# Patient Record
Sex: Female | Born: 1975 | Race: White | Hispanic: No | Marital: Single | Smoking: Never smoker
Health system: Southern US, Community
[De-identification: ages and names within clinical notes are randomized; demographics above are authoritative.]

---

## 2008-11-07 ENCOUNTER — Ambulatory Visit: Payer: Self-pay | Admitting: Family Medicine

## 2013-04-11 ENCOUNTER — Emergency Department: Payer: Self-pay | Admitting: Emergency Medicine
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
## 2022-05-14 ENCOUNTER — Emergency Department (HOSPITAL_COMMUNITY)
Admission: EM | Admit: 2022-05-14 | Discharge: 2022-05-15 | Disposition: A | Discharge status: Home / Self Care | Payer: 59 | Attending: Emergency Medicine | Admitting: Emergency Medicine

## 2022-05-14 ENCOUNTER — Other Ambulatory Visit: Payer: Self-pay

## 2022-05-14 ENCOUNTER — Encounter (HOSPITAL_COMMUNITY): Payer: Self-pay

## 2022-05-14 DIAGNOSIS — Z5189 Encounter for other specified aftercare: Secondary | ICD-10-CM

## 2022-05-14 DIAGNOSIS — L7622 Postprocedural hemorrhage and hematoma of skin and subcutaneous tissue following other procedure: Secondary | ICD-10-CM | POA: Insufficient documentation

## 2022-05-14 LAB — CBC WITH DIFFERENTIAL/PLATELET
Abs Immature Granulocytes: 0.02 10*3/uL (ref 0.00–0.07)
Basophils Absolute: 0.1 10*3/uL (ref 0.0–0.1)
Basophils Relative: 1 %
Eosinophils Absolute: 0.2 10*3/uL (ref 0.0–0.5)
Eosinophils Relative: 2 %
HCT: 37.4 % (ref 36.0–46.0)
Hemoglobin: 11.5 g/dL — ABNORMAL LOW (ref 12.0–15.0)
Immature Granulocytes: 0 %
Lymphocytes Relative: 23 %
Lymphs Abs: 2.1 10*3/uL (ref 0.7–4.0)
MCH: 27.9 pg (ref 26.0–34.0)
MCHC: 30.7 g/dL (ref 30.0–36.0)
MCV: 90.8 fL (ref 80.0–100.0)
Monocytes Absolute: 0.6 10*3/uL (ref 0.1–1.0)
Monocytes Relative: 6 %
Neutro Abs: 6.1 10*3/uL (ref 1.7–7.7)
Neutrophils Relative %: 68 %
Platelets: 275 10*3/uL (ref 150–400)
RBC: 4.12 MIL/uL (ref 3.87–5.11)
RDW: 13.7 % (ref 11.5–15.5)
WBC: 9.1 10*3/uL (ref 4.0–10.5)
nRBC: 0 % (ref 0.0–0.2)

## 2022-05-14 NOTE — ED Provider Triage Note (Signed)
Emergency Medicine Provider Triage Evaluation Note  Hannah James , a 46 y.o. female  was evaluated in triage.  Pt complains of continued bleeding from right breast status post biopsy earlier today.  Review of Systems  Positive: wound Negative: fever  Physical Exam  BP 124/84   Pulse 94   Temp (!) 97.4 F (36.3 C)   Resp 18   SpO2 100%  Gen:   Awake, no distress   Resp:  Normal effort  MSK:   Moves extremities without difficulty  Other:  Bleeding from right breast  Medical Decision Making  Medically screening exam initiated at 11:32 PM.  Appropriate orders placed.  Hannah James was informed that the remainder of the evaluation will be completed by another provider, this initial triage assessment does not replace that evaluation, and the importance of remaining in the ED until their evaluation is complete.  Labs to check hemoglobin   Hannah Stabile, PA-C 05/14/22 2332

## 2022-05-14 NOTE — ED Triage Notes (Signed)
Right breast biopsy done today at [facility].   Has had excessive bleeding since procedure.

## 2022-05-15 LAB — BASIC METABOLIC PANEL
Anion gap: 10 (ref 5–15)
BUN: 9 mg/dL (ref 6–20)
CO2: 21 mmol/L — ABNORMAL LOW (ref 22–32)
Calcium: 8.5 mg/dL — ABNORMAL LOW (ref 8.9–10.3)
Chloride: 106 mmol/L (ref 98–111)
Creatinine, Ser: 0.72 mg/dL (ref 0.44–1.00)
GFR, Estimated: >60 mL/min (ref >60)
Glucose, Bld: 112 mg/dL — ABNORMAL HIGH (ref 70–99)
Potassium: 3.4 mmol/L — ABNORMAL LOW (ref 3.5–5.1)
Sodium: 137 mmol/L (ref 135–145)

## 2022-05-15 NOTE — Discharge Instructions (Signed)
Return to the ER for significantly worsening bleeding, worsening pain, fevers, or for other new and concerning symptoms.

## 2022-05-15 NOTE — ED Provider Notes (Signed)
  [facility]-EMERGENCY DEPT Provider Note   CSN: [id number] Arrival date & time: 05/14/22  2317     History  Chief Complaint  Patient presents with   Post-op Problem    Rusty Glodowski is a 46 y.o. female.  Patient is a 46 year old female with no significant past medical history.  Patient presenting with complaints of bleeding from a breast biopsy site.  This was performed earlier today.  She woke up this evening with bleeding on the dressing and blood on her shirt.  This seems to be controlled with direct pressure.  She was told to come to the ER to be evaluated.  The history is provided by the patient.       Home Medications Prior to Admission medications   Not on File      Allergies    Wheat bran    Review of Systems   Review of Systems  All other systems reviewed and are negative.   Physical Exam Updated Vital Signs BP 124/84   Pulse 94   Temp (!) 97.4 F (36.3 C)   Resp 18   SpO2 100%  Physical Exam Vitals and nursing note reviewed.  Constitutional:      General: She is not in acute distress.    Appearance: Normal appearance. She is not ill-appearing.  HENT:     Head: Normocephalic and atraumatic.  Pulmonary:     Effort: Pulmonary effort is normal.  Skin:    General: Skin is warm and dry.     Comments: The biopsy site to the right lateral breast has Steri-Strips and a Band-Aid in place.  These are slightly saturated with blood, but no active bleeding or palpable hematoma.  Neurological:     Mental Status: She is alert.     ED Results / Procedures / Treatments   Labs (all labs ordered are listed, but only abnormal results are displayed) Labs Reviewed  CBC WITH DIFFERENTIAL/PLATELET - Abnormal; Notable for the following components:      Result Value   Hemoglobin 11.5 (*)    All other components within normal limits  BASIC METABOLIC PANEL - Abnormal; Notable for the following components:   Potassium 3.4 (*)    CO2 21 (*)     Glucose, Bld 112 (*)    Calcium 8.5 (*)    All other components within normal limits    EKG None  Radiology No results found.  Procedures Procedures    Medications Ordered in ED Medications - No data to display  ED Course/ Medical Decision Making/ A&P  Bleeding controlled with direct pressure.  A more compressive dressing including gauze and Tegaderm will be applied.  Patient to be discharged with as needed return.  Final Clinical Impression(s) / ED Diagnoses Final diagnoses:  None    Rx / DC Orders ED Discharge Orders     None         Geoffery Lyons, MD 05/15/22 0028
